# Patient Record
Sex: Male | Born: 1990 | Race: White | Hispanic: No | Marital: Single | Smoking: Never smoker
Health system: Southern US, Community
[De-identification: ages and names within clinical notes are randomized; demographics above are authoritative.]

## PROBLEM LIST (undated history)

## (undated) HISTORY — PX: TYMPANOSTOMY TUBE PLACEMENT: SHX32

---

## 2015-12-31 ENCOUNTER — Emergency Department
Admission: EM | Admit: 2015-12-31 | Discharge: 2015-12-31 | Disposition: A | Discharge status: Home / Self Care | Payer: Worker's Compensation | Attending: Emergency Medicine | Admitting: Emergency Medicine

## 2015-12-31 ENCOUNTER — Emergency Department: Payer: Worker's Compensation

## 2015-12-31 ENCOUNTER — Encounter: Payer: Self-pay | Admitting: Emergency Medicine

## 2015-12-31 DIAGNOSIS — S92301A Fracture of unspecified metatarsal bone(s), right foot, initial encounter for closed fracture: Secondary | ICD-10-CM

## 2015-12-31 DIAGNOSIS — S93431A Sprain of tibiofibular ligament of right ankle, initial encounter: Secondary | ICD-10-CM | POA: Insufficient documentation

## 2015-12-31 DIAGNOSIS — Y9367 Activity, basketball: Secondary | ICD-10-CM | POA: Diagnosis not present

## 2015-12-31 DIAGNOSIS — Y92310 Basketball court as the place of occurrence of the external cause: Secondary | ICD-10-CM | POA: Insufficient documentation

## 2015-12-31 DIAGNOSIS — S99921A Unspecified injury of right foot, initial encounter: Secondary | ICD-10-CM | POA: Diagnosis present

## 2015-12-31 DIAGNOSIS — X501XXA Overexertion from prolonged static or awkward postures, initial encounter: Secondary | ICD-10-CM | POA: Diagnosis not present

## 2015-12-31 DIAGNOSIS — S93491A Sprain of other ligament of right ankle, initial encounter: Secondary | ICD-10-CM

## 2015-12-31 DIAGNOSIS — Y990 Civilian activity done for income or pay: Secondary | ICD-10-CM | POA: Diagnosis not present

## 2015-12-31 DIAGNOSIS — S92351A Displaced fracture of fifth metatarsal bone, right foot, initial encounter for closed fracture: Secondary | ICD-10-CM | POA: Insufficient documentation

## 2015-12-31 MED ORDER — MELOXICAM 15 MG PO TABS: 15.0000 mg | ORAL_TABLET | Freq: Every day | ORAL | Status: AC

## 2015-12-31 NOTE — ED Provider Notes (Signed)
[facility] Emergency Department Provider Note  ____________________________________________  Time seen: Approximately 7:55 PM  I have reviewed the triage vital signs and the nursing notes.   HISTORY  Chief Complaint Foot Injury    HPI Zachary Wong is a 25 y.o. male who presents emergency department complaining of right foot pain. 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 He states that his ankle rolled and he is not experiencing pain to the lateral portion mid foot. Patient denies any numbness or tingling to the distal extremity. Patient does endorse some swelling to both the lateral foot as well as his ankle. Patient denies any ankle pain. He denies any knee pain. Patient denied his head or lose consciousness at any point. Patient has not taken any medications prior to arrival.   History reviewed. No pertinent past medical history.  There are no active problems to display for this patient.   History reviewed. No pertinent past surgical history.  Current Outpatient Rx  Name  Route  Sig  Dispense  Refill  . meloxicam (MOBIC) 15 MG tablet   Oral   Take 1 tablet (15 mg total) by mouth daily.   30 tablet   0     Allergies Review of patient's allergies indicates no known allergies.  History reviewed. No pertinent family history.  Social History Social History  Substance Use Topics  . Smoking status: Never Smoker   . Smokeless tobacco: None  . Alcohol Use: None     Review of Systems  Constitutional: No fever/chills Cardiovascular: no chest pain. Respiratory: no cough. No SOB. Musculoskeletal: Negative for back pain.Positive for right lateral foot pain. Skin: Negative for rash. Neurological: Negative for headaches, focal weakness or numbness. 10-point ROS otherwise negative.  ____________________________________________   PHYSICAL EXAM:  VITAL SIGNS: ED  Triage Vitals  Enc Vitals Group     BP --      Pulse Rate 12/31/15 1854 105     Resp 12/31/15 1854 18     Temp 12/31/15 1854 98.1 F (36.7 C)     Temp Source 12/31/15 1854 Oral     SpO2 12/31/15 1854 99 %     Weight 12/31/15 1854 320 lb (145.151 kg)     Height 12/31/15 1854 6\' 4"  (1.93 m)     Head Cir --      Peak Flow --      Pain Score --      Pain Loc --      Pain Edu? --      Excl. in GC? --      Constitutional: Alert and oriented. Well appearing and in no acute distress. Eyes: Conjunctivae are normal. PERRL. EOMI. Head: Atraumatic. Cardiovascular: Normal rate, regular rhythm. Normal S1 and S2.  Good peripheral circulation. Respiratory: Normal respiratory effort without tachypnea or retractions. Lungs CTAB. Musculoskeletal: No visible deformity to right foot when compared to left. Patient does have edema noted to the lateral malleolus as well as the lateral portion of the foot. Patient does have full range of motion to his ankle. Full range of motion to all digits. Patient is tender to palpation over the base of the fifth metatarsal. No tenderness to palpation over the ankle joint. No palpable abnormality. Dorsalis pedis pulse and cap refill are present. Sensation intact 5 digits. Neurologic:  Normal speech and language. No gross focal neurologic deficits are appreciated.  Skin:  Skin is  warm, dry and intact. No rash noted. Psychiatric: Mood and affect are normal. Speech and behavior are normal. Patient exhibits appropriate insight and judgement.   ____________________________________________   LABS (all labs ordered are listed, but only abnormal results are displayed)  Labs Reviewed - No data to display ____________________________________________  EKG   ____________________________________________  RADIOLOGY Festus Barren Angelisse Riso, personally viewed and evaluated these images (plain radiographs) as part of my medical decision making, as well as reviewing the  written report by the radiologist.  Dg Ankle Complete Right  12/31/2015  CLINICAL DATA:  Jumped and landed wrong, lateral ankle pain and swelling EXAM: RIGHT ANKLE - COMPLETE 3+ VIEW COMPARISON:  None. FINDINGS: Mild soft tissue swelling over the lateral malleolus. Mortise intact. Fracture at the base of the fifth metatarsal noted. Small ankle joint effusion. IMPRESSION: Fracture fifth metatarsal.  Ankle sprain. Electronically Signed   By: Esperanza Heir M.D.   On: 12/31/2015 19:39   Dg Foot Complete Right  12/31/2015  CLINICAL DATA:  Jumped and landed awkward playing basketball with lateral right foot pain. EXAM: RIGHT FOOT COMPLETE - 3+ VIEW COMPARISON:  None. FINDINGS: Examination demonstrates a mildly displaced transverse fracture over the base of the fifth metatarsal. Remainder the exam is within normal. IMPRESSION: Displaced fracture along the base of the fifth metatarsal. Electronically Signed   By: Elberta Fortis M.D.   On: 12/31/2015 19:38    ____________________________________________    PROCEDURES  Procedure(s) performed:    SPLINT APPLICATION Date/Time: 9:11 PM Authorized by: Racheal Patches Consent: Verbal consent obtained. Risks and benefits: risks, benefits and alternatives were discussed Consent given by: patient Splint applied by: ED tech  Location details: Right ankle/foot  Splint type: Posterior OCL  Supplies used: Ortho-Glass  Post-procedure: The splinted body part was neurovascularly unchanged following the procedure. Patient tolerance: Patient tolerated the procedure well with no immediate complications.      Medications - No data to display   ____________________________________________   INITIAL IMPRESSION / ASSESSMENT AND PLAN / ED COURSE  Pertinent labs & imaging results that were available during my care of the patient were reviewed by me and considered in my medical decision making (see chart for details).  Patient's diagnosis is  consistent with fracture to the base of the fifth metatarsal and posterior talofibular ligament ankle sprain. Patient will be discharged home with prescriptions for anti-inflammatories for symptom control. Patient is to follow up with orthopedics. Patient is given ED precautions to return to the ED for any worsening or new symptoms.     ____________________________________________  FINAL CLINICAL IMPRESSION(S) / ED DIAGNOSES  Final diagnoses:  Fracture of fifth metatarsal bone, right, closed, initial encounter  Sprain of posterior talofibular ligament of ankle, right, initial encounter      NEW MEDICATIONS STARTED DURING THIS VISIT:  Discharge Medication List as of 12/31/2015  8:15 PM    START taking these medications   Details  meloxicam (MOBIC) 15 MG tablet Take 1 tablet (15 mg total) by mouth daily., Starting 12/31/2015, Until Discontinued, Print            This chart was dictated using voice recognition software/Dragon. Despite best efforts to proofread, errors can occur which can change the meaning. Any change was purely unintentional.    Racheal Patches, PA-C 12/31/15 2111  Jene Every, MD 12/31/15 2156

## 2015-12-31 NOTE — ED Notes (Signed)
Reports working at [employer] and hurt left foot.

## 2015-12-31 NOTE — Discharge Instructions (Signed)
Ankle Sprain °An ankle sprain is an injury to the strong, fibrous tissues (ligaments) that hold the bones of your ankle joint together.  °CAUSES °An ankle sprain is usually caused by a fall or by twisting your ankle. Ankle sprains most commonly occur when you step on the outer edge of your foot, and your ankle turns inward. People who participate in sports are more prone to these types of injuries.  °SYMPTOMS  °· Pain in your ankle. The pain may be present at rest or only when you are trying to stand or walk. °· Swelling. °· Bruising. Bruising may develop immediately or within 1 to 2 days after your injury. °· Difficulty standing or walking, particularly when turning corners or changing directions. °DIAGNOSIS  °Your caregiver will ask you details about your injury and perform a physical exam of your ankle to determine if you have an ankle sprain. During the physical exam, your caregiver will press on and apply pressure to specific areas of your foot and ankle. Your caregiver will try to move your ankle in certain ways. An X-ray exam may be done to be sure a bone was not broken or a ligament did not separate from one of the bones in your ankle (avulsion fracture).  °TREATMENT  °Certain types of braces can help stabilize your ankle. Your caregiver can make a recommendation for this. Your caregiver may recommend the use of medicine for pain. If your sprain is severe, your caregiver may refer you to a surgeon who helps to restore function to parts of your skeletal system (orthopedist) or a physical therapist. °HOME CARE INSTRUCTIONS  °· Apply ice to your injury for 1-2 days or as directed by your caregiver. Applying ice helps to reduce inflammation and pain. °· Put ice in a plastic bag. °· Place a towel between your skin and the bag. °· Leave the ice on for 15-20 minutes at a time, every 2 hours while you are awake. °· Only take over-the-counter or prescription medicines for pain, discomfort, or fever as directed by  your caregiver. °· Elevate your injured ankle above the level of your heart as much as possible for 2-3 days. °· If your caregiver recommends crutches, use them as instructed. Gradually put weight on the affected ankle. Continue to use crutches or a cane until you can walk without feeling pain in your ankle. °· If you have a plaster splint, wear the splint as directed by your caregiver. Do not rest it on anything harder than a pillow for the first 24 hours. Do not put weight on it. Do not get it wet. You may take it off to take a shower or bath. °· You may have been given an elastic bandage to wear around your ankle to provide support. If the elastic bandage is too tight (you have numbness or tingling in your foot or your foot becomes cold and blue), adjust the bandage to make it comfortable. °· If you have an air splint, you may blow more air into it or let air out to make it more comfortable. You may take your splint off at night and before taking a shower or bath. Wiggle your toes in the splint several times per day to decrease swelling. °SEEK MEDICAL CARE IF:  °· You have rapidly increasing bruising or swelling. °· Your toes feel extremely cold or you lose feeling in your foot. °· Your pain is not relieved with medicine. °SEEK IMMEDIATE MEDICAL CARE IF: °· Your toes are numb or blue. °·   You have severe pain that is increasing. MAKE SURE YOU:   Understand these instructions.  Will watch your condition.  Will get help right away if you are not doing well or get worse.   This information is not intended to replace advice given to you by your health care provider. Make sure you discuss any questions you have with your health care provider.   Document Released: 09/17/2005 Document Revised: 10/08/2014 Document Reviewed: 09/29/2011 Elsevier Interactive Patient Education 2016 [employer] or Splint Care Casts and splints support injured limbs and keep bones from moving while they heal. It is  important to care for your cast or splint at home.  HOME CARE INSTRUCTIONS  Keep the cast or splint uncovered during the drying period. It can take 24 to 48 hours to dry if it is made of plaster. A fiberglass cast will dry in less than 1 hour.  Do not rest the cast on anything harder than a pillow for the first 24 hours.  Do not put weight on your injured limb or apply pressure to the cast until your health care provider gives you permission.  Keep the cast or splint dry. Wet casts or splints can lose their shape and may not support the limb as well. A wet cast that has lost its shape can also create harmful pressure on your skin when it dries. Also, wet skin can become infected.  Cover the cast or splint with a plastic bag when bathing or when out in the rain or snow. If the cast is on the trunk of the body, take sponge baths until the cast is removed.  If your cast does become wet, dry it with a towel or a blow dryer on the cool setting only.  Keep your cast or splint clean. Soiled casts may be wiped with a moistened cloth.  Do not place any hard or soft foreign objects under your cast or splint, such as cotton, toilet paper, lotion, or powder.  Do not try to scratch the skin under the cast with any object. The object could get stuck inside the cast. Also, scratching could lead to an infection. If itching is a problem, use a blow dryer on a cool setting to relieve discomfort.  Do not trim or cut your cast or remove padding from inside of it.  Exercise all joints next to the injury that are not immobilized by the cast or splint. For example, if you have a long leg cast, exercise the hip joint and toes. If you have an arm cast or splint, exercise the shoulder, elbow, thumb, and fingers.  Elevate your injured arm or leg on 1 or 2 pillows for the first 1 to 3 days to decrease swelling and pain.It is best if you can comfortably elevate your cast so it is higher than your heart. SEEK MEDICAL  CARE IF:   Your cast or splint cracks.  Your cast or splint is too tight or too loose.  You have unbearable itching inside the cast.  Your cast becomes wet or develops a soft spot or area.  You have a bad smell coming from inside your cast.  You get an object stuck under your cast.  Your skin around the cast becomes red or raw.  You have new pain or worsening pain after the cast has been applied. SEEK IMMEDIATE MEDICAL CARE IF:   You have fluid leaking through the cast.  You are unable to move your fingers or toes.  You have discolored (blue or white), cool, painful, or very swollen fingers or toes beyond the cast.  You have tingling or numbness around the injured area.  You have severe pain or pressure under the cast.  You have any difficulty with your breathing or have shortness of breath.  You have chest pain.   This information is not intended to replace advice given to you by your health care provider. Make sure you discuss any questions you have with your health care provider.   Document Released: 09/14/2000 Document Revised: 07/08/2013 Document Reviewed: 03/26/2013 Elsevier Interactive Patient Education 2016 [employer]  Metatarsal Fracture A metatarsal fracture is a break in a metatarsal bone. Metatarsal bones connect your toe bones to your ankle bones. CAUSES This type of fracture may be caused by:  A sudden twisting of your foot.  A fall onto your foot.  Overuse or repetitive exercise. RISK FACTORS This condition is more likely to develop in people who:  Play contact sports.  Have a bone disease.  Have a low calcium level. SYMPTOMS Symptoms of this condition include:  Pain that is worse when walking or standing.  Pain when pressing on the foot or moving the toes.  Swelling.  Bruising on the top or bottom of the foot.  A foot that appears shorter than the other one. DIAGNOSIS This condition is diagnosed with a physical exam. You may  also have imaging tests, such as:  X-rays.  A CT scan.  MRI. TREATMENT Treatment for this condition depends on its severity and whether a bone has moved out of place. Treatment may involve:  Rest.  Wearing foot support such as a cast, splint, or boot for several weeks.  Using crutches.  Surgery to move bones back into the right position. Surgery is usually needed if there are many pieces of broken bone or bones that are very out of place (displaced fracture).  Physical therapy. This may be needed to help you regain full movement and strength in your foot. You will need to return to your health care provider to have X-rays taken until your bones heal. Your health care provider will look at the X-rays to make sure that your foot is healing well. HOME CARE INSTRUCTIONS  If You Have a Cast:  Do not stick anything inside the cast to scratch your skin. Doing that increases your risk of infection.  Check the skin around the cast every day. Report any concerns to your health care provider. You may put lotion on dry skin around the edges of the cast. Do not apply lotion to the skin underneath the cast.  Keep the cast clean and dry. If You Have a Splint or a Supportive Boot:  Wear it as directed by your health care provider. Remove it only as directed by your health care provider.  Loosen it if your toes become numb and tingle, or if they turn cold and blue.  Keep it clean and dry. Bathing  Do not take baths, swim, or use a hot tub until your health care provider approves. Ask your health care provider if you can take showers. You may only be allowed to take sponge baths for bathing.  If your health care provider approves bathing and showering, cover the cast or splint with a watertight plastic bag to protect it from water. Do not let the cast or splint get wet. Managing Pain, Stiffness, and Swelling  If directed, apply ice to the injured area (if you have a splint,  not a  cast).  Put ice in a plastic bag.  Place a towel between your skin and the bag.  Leave the ice on for 20 minutes, 2-3 times per day.  Move your toes often to avoid stiffness and to lessen swelling.  Raise (elevate) the injured area above the level of your heart while you are sitting or lying down. Driving  Do not drive or operate heavy machinery while taking pain medicine.  Do not drive while wearing foot support on a foot that you use for driving. Activity  Return to your normal activities as directed by your health care provider. Ask your health care provider what activities are safe for you.  Perform exercises as directed by your health care provider or physical therapist. Safety  Do not use the injured foot to support your body weight until your health care provider says that you can. Use crutches as directed by your health care provider. General Instructions  Do not put pressure on any part of the cast or splint until it is fully hardened. This may take several hours.  Do not use any tobacco products, including cigarettes, chewing tobacco, or e-cigarettes. Tobacco can delay bone healing. If you need help quitting, ask your health care provider.  Take medicines only as directed by your health care provider.  Keep all follow-up visits as directed by your health care provider. This is important. SEEK MEDICAL CARE IF:  You have a fever.  Your cast, splint, or boot is too loose or too tight.  Your cast, splint, or boot is damaged.  Your pain medicine is not helping.  You have pain, tingling, or numbness in your foot that is not going away. SEEK IMMEDIATE MEDICAL CARE IF:  You have severe pain.  You have tingling or numbness in your foot that is getting worse.  Your foot feels cold or becomes numb.  Your foot changes color.   This information is not intended to replace advice given to you by your health care provider. Make sure you discuss any questions you have  with your health care provider.   Document Released: 06/09/2002 Document Revised: 02/01/2015 Document Reviewed: 07/14/2014 Elsevier Interactive Patient Education [employer]

## 2017-09-04 ENCOUNTER — Emergency Department (HOSPITAL_COMMUNITY): Payer: Self-pay

## 2017-09-04 ENCOUNTER — Encounter (HOSPITAL_COMMUNITY): Payer: Self-pay

## 2017-09-04 ENCOUNTER — Other Ambulatory Visit: Payer: Self-pay

## 2017-09-04 ENCOUNTER — Emergency Department (HOSPITAL_COMMUNITY)
Admission: EM | Admit: 2017-09-04 | Discharge: 2017-09-04 | Disposition: A | Discharge status: Home / Self Care | Payer: Self-pay | Attending: Emergency Medicine | Admitting: Emergency Medicine

## 2017-09-04 DIAGNOSIS — S7001XA Contusion of right hip, initial encounter: Secondary | ICD-10-CM | POA: Insufficient documentation

## 2017-09-04 DIAGNOSIS — Y999 Unspecified external cause status: Secondary | ICD-10-CM | POA: Insufficient documentation

## 2017-09-04 DIAGNOSIS — Y92411 Interstate highway as the place of occurrence of the external cause: Secondary | ICD-10-CM | POA: Insufficient documentation

## 2017-09-04 DIAGNOSIS — S56912A Strain of unspecified muscles, fascia and tendons at forearm level, left arm, initial encounter: Secondary | ICD-10-CM | POA: Insufficient documentation

## 2017-09-04 DIAGNOSIS — Z79899 Other long term (current) drug therapy: Secondary | ICD-10-CM | POA: Insufficient documentation

## 2017-09-04 DIAGNOSIS — Y9389 Activity, other specified: Secondary | ICD-10-CM | POA: Insufficient documentation

## 2017-09-04 MED ORDER — IBUPROFEN 400 MG PO TABS
600.0000 mg | ORAL_TABLET | Freq: Once | ORAL | Status: AC
  Administered 2017-09-04: 600 mg via ORAL
  Filled 2017-09-04: qty 1

## 2017-09-04 MED ORDER — HYDROCODONE-ACETAMINOPHEN 5-325 MG PO TABS
2.0000 | ORAL_TABLET | Freq: Once | ORAL | Status: AC
  Administered 2017-09-04: 2 via ORAL
  Filled 2017-09-04: qty 2

## 2017-09-04 NOTE — ED Notes (Signed)
ED Provider at bedside. 
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
## 2017-09-04 NOTE — ED Notes (Signed)
Pt ambulatory to bathroom at this time.
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
## 2017-09-04 NOTE — ED Notes (Signed)
Pt to xray at this time.
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
## 2021-10-01 ENCOUNTER — Encounter: Payer: Self-pay | Admitting: Emergency Medicine

## 2021-10-01 ENCOUNTER — Other Ambulatory Visit: Payer: Self-pay

## 2021-10-01 ENCOUNTER — Emergency Department
Admission: EM | Admit: 2021-10-01 | Discharge: 2021-10-01 | Disposition: A | Discharge status: Home / Self Care | Payer: Self-pay | Attending: Emergency Medicine | Admitting: Emergency Medicine

## 2021-10-01 DIAGNOSIS — H9201 Otalgia, right ear: Secondary | ICD-10-CM | POA: Insufficient documentation

## 2021-10-01 MED ORDER — PREDNISONE 20 MG PO TABS
60.0000 mg | ORAL_TABLET | Freq: Once | ORAL | Status: AC
  Administered 2021-10-01: 60 mg via ORAL
  Filled 2021-10-01: qty 3

## 2021-10-01 MED ORDER — PREDNISONE 10 MG (21) PO TBPK: ORAL_TABLET | ORAL | 0 refills | Status: AC

## 2021-10-01 NOTE — Discharge Instructions (Addendum)
Take tapered steroid as directed.  

## 2021-10-01 NOTE — ED Triage Notes (Signed)
Pt reports right ear is clogged up and has been for a week. Pt states has been taking sudafed and other OTC meds with no relief.

## 2021-10-01 NOTE — ED Notes (Signed)
Pt complains of right ear being "clogged". Pt complains of no pain

## 2021-10-01 NOTE — ED Provider Notes (Signed)
° °  [facility] Provider Note    Event Date/Time   First MD Initiated Contact with Patient 10/01/21 1613     (approximate)   History   Otalgia   HPI  Zachary Wong is a 31 y.o. male presents to the emergency department with right ear fullness since having viral URI 1 week ago.  No fever, chills or history of otitis media.  No history of diabetes.      Physical Exam   Triage Vital Signs: ED Triage Vitals  Enc Vitals Group     BP 10/01/21 1502 139/81     Pulse Rate 10/01/21 1502 88     Resp 10/01/21 1502 16     Temp 10/01/21 1502 98.2 F (36.8 C)     Temp Source 10/01/21 1502 Oral     SpO2 10/01/21 1502 98 %     Weight 10/01/21 1449 (!) 335 lb 1.6 oz (152 kg)     Height 10/01/21 1449 6\' 2"  (1.88 m)     Head Circumference --      Peak Flow --      Pain Score 10/01/21 1449 0     Pain Loc --      Pain Edu? --      Excl. in [place]? --     Most recent vital signs: Vitals:   10/01/21 1502  BP: 139/81  Pulse: 88  Resp: 16  Temp: 98.2 F (36.8 C)  SpO2: 98%    General: Awake, no distress.  HEENT: Right tympanic membrane partially occluded with cerumen.  Right TM appears to be bulging with clear to cloudy fluid behind TM.  No erythema. CV:  Good peripheral perfusion.  Resp:  Normal effort.  Abd:  No distention.  Other:     ED Results / Procedures / Treatments   Labs (all labs ordered are listed, but only abnormal results are displayed) Labs Reviewed - No data to display   EKG     RADIOLOGY    PROCEDURES:       MEDICATIONS ORDERED IN ED: Medications  predniSONE (DELTASONE) tablet 60 mg (has no administration in time range)     IMPRESSION / MDM / ASSESSMENT AND PLAN / ED COURSE  I reviewed the triage vital signs and the nursing notes.                              Differential diagnosis includes, but is not limited to, otitis media, otitis externa, middle ear effusion   Assessment and plan Ear  pain 31 year old male presents to the emergency department with right ear fullness.  Physical exam concerning for middle ear effusion.  Patient has already tried Flonase and antihistamine with little relief.  We will proceed with prednisone taper.     FINAL CLINICAL IMPRESSION(S) / ED DIAGNOSES   Final diagnoses:  Right ear pain     Rx / DC Orders   ED Discharge Orders          Ordered    predniSONE (STERAPRED UNI-PAK 21 TAB) 10 MG (21) TBPK tablet        10/01/21 1636             Note:  This document was prepared using Dragon voice recognition software and may include unintentional dictation errors.    11/29/21 [place], PA-C 10/01/21 1642    11/29/21, MD 10/01/21 [phone]

## 2021-12-05 ENCOUNTER — Ambulatory Visit
Admission: EM | Admit: 2021-12-05 | Discharge: 2021-12-05 | Disposition: A | Discharge status: Home / Self Care | Payer: Self-pay | Attending: Emergency Medicine | Admitting: Emergency Medicine

## 2021-12-05 ENCOUNTER — Other Ambulatory Visit: Payer: Self-pay

## 2021-12-05 ENCOUNTER — Encounter: Payer: Self-pay | Admitting: Emergency Medicine

## 2021-12-05 DIAGNOSIS — J020 Streptococcal pharyngitis: Secondary | ICD-10-CM

## 2021-12-05 LAB — POCT RAPID STREP A (OFFICE): Rapid Strep A Screen: POSITIVE — AB

## 2021-12-05 MED ORDER — AMOXICILLIN 875 MG PO TABS: 875.0000 mg | ORAL_TABLET | Freq: Two times a day (BID) | ORAL | 0 refills | Status: AC

## 2021-12-05 NOTE — Discharge Instructions (Addendum)
You have strep throat.  Take the amoxicillin as directed.  Follow up with your primary care provider if your symptoms are not improving.   ? ?

## 2021-12-05 NOTE — ED Triage Notes (Signed)
Pt here with sore throat and fever x 2 days.  

## 2021-12-05 NOTE — ED Provider Notes (Signed)
?[facility] ? ? ? ?CSN: [id number] ?Arrival date & time: 12/05/21  1156 ? ? ?  ? ?History   ?Chief Complaint ?Chief Complaint  ?Patient presents with  ? Sore Throat  ? Fever  ? ? ?HPI ?Zachary Wong is a 31 y.o. male.  Accompanied by his mother, patient presents with fever and sore throat x2 days.  No rash, cough, shortness of breath, or other symptoms.  Treatment at home with ibuprofen.  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 ? ?The history is provided by the patient and a parent.  ? ?History reviewed. No pertinent past medical history. ? ?There are no problems to display for this patient. ? ? ?History reviewed. No pertinent surgical history. ? ? ? ? ?Home Medications   ? ?Prior to Admission medications   ?Medication Sig Start Date End Date Taking? Authorizing Provider  ?amoxicillin (AMOXIL) 875 MG tablet Take 1 tablet (875 mg total) by mouth 2 (two) times daily for 10 days. 12/05/21 12/15/21 Yes Sharion Balloon, NP  ?meloxicam (MOBIC) 15 MG tablet Take 1 tablet (15 mg total) by mouth daily. 12/31/15   Cuthriell, Charline Bills, PA-C  ?predniSONE (STERAPRED UNI-PAK 21 TAB) 10 MG (21) TBPK tablet 6,5,4,3,2,1 10/01/21   Lannie Fields, PA-C  ? ? ?Family History ?Family History  ?Family history unknown: Yes  ? ? ?Social History ?Social History  ? ?Tobacco Use  ? Smoking status: Never  ? Smokeless tobacco: Never  ?Substance Use Topics  ? Alcohol use: No  ? Drug use: No  ? ? ? ?Allergies   ?Red dye ? ? ?Review of Systems ?Review of Systems  ?Constitutional:  Positive for fever. Negative for chills.  ?HENT:  Positive for sore throat. Negative for ear pain.   ?Respiratory:  Negative for cough and shortness of breath.   ?Cardiovascular:  Negative for chest pain and palpitations.  ?Gastrointestinal:  Negative for diarrhea and vomiting.  ?Skin:  Negative for color change and rash.  ?All other systems reviewed and are negative. ? ? ?Physical Exam ?Triage Vital Signs ?ED Triage Vitals  ?Enc  Vitals Group  ?   BP   ?   Pulse   ?   Resp   ?   Temp   ?   Temp src   ?   SpO2   ?   Weight   ?   Height   ?   Head Circumference   ?   Peak Flow   ?   Pain Score   ?   Pain Loc   ?   Pain Edu?   ?   Excl. in [place]?   ? ?No data found. ? ?Updated Vital Signs ?There were no vitals taken for this visit. ? ?Visual Acuity ?Right Eye Distance:   ?Left Eye Distance:   ?Bilateral Distance:   ? ?Right Eye Near:   ?Left Eye Near:    ?Bilateral Near:    ? ?Physical Exam ?Vitals and nursing note reviewed.  ?Constitutional:   ?   General: He is not in acute distress. ?   Appearance: He is well-developed.  ?HENT:  ?   Right Ear: Tympanic membrane normal.  ?   Left Ear: Tympanic membrane normal.  ?   Nose: Nose normal.  ?   Mouth/Throat:  ?   Mouth: Mucous membranes are moist.  ?   Pharynx: Posterior oropharyngeal erythema present.  ?Cardiovascular:  ?   Rate  and Rhythm: Normal rate and regular rhythm.  ?   Heart sounds: Normal heart sounds.  ?Pulmonary:  ?   Effort: Pulmonary effort is normal. No respiratory distress.  ?   Breath sounds: Normal breath sounds.  ?Musculoskeletal:  ?   Cervical back: Neck supple.  ?Skin: ?   General: Skin is warm and dry.  ?Neurological:  ?   Mental Status: He is alert.  ?Psychiatric:     ?   Mood and Affect: Mood normal.     ?   Behavior: Behavior normal.  ? ? ? ?UC Treatments / Results  ?Labs ?(all labs ordered are listed, but only abnormal results are displayed) ?Labs Reviewed  ?POCT RAPID STREP A (OFFICE) - Abnormal; Notable for the following components:  ?    Result Value  ? Rapid Strep A Screen Positive (*)   ? All other components within normal limits  ? ? ?EKG ? ? ?Radiology ?No results found. ? ?Procedures ?Procedures (including critical care time) ? ?Medications Ordered in UC ?Medications - No data to display ? ?Initial Impression / Assessment and Plan / UC Course  ?I have reviewed the triage vital signs and the nursing notes. ? ?Pertinent labs & imaging results that were available during  my care of the patient were reviewed by me and considered in my medical decision making (see chart for details). ? ? Strep pharyngitis.  Rapid strep positive.  Treating with amoxicillin.  Discussed Tylenol or ibuprofen as needed.  Instructed patient to follow up with his PCP if his symptoms are not improving.  He agrees to plan of care.  ? ? ? ?Final Clinical Impressions(s) / UC Diagnoses  ? ?Final diagnoses:  ?Strep pharyngitis  ? ? ? ?Discharge Instructions   ? ?  ?You have strep throat.  Take the amoxicillin as directed.  Follow up with your primary care provider if your symptoms are not improving.   ? ? ? ? ? ? ?ED Prescriptions   ? ? Medication Sig Dispense Auth. Provider  ? amoxicillin (AMOXIL) 875 MG tablet Take 1 tablet (875 mg total) by mouth 2 (two) times daily for 10 days. 20 tablet Sharion Balloon, NP  ? ?  ? ?PDMP not reviewed this encounter. ?  ?Sharion Balloon, NP ?12/05/21 1233 ? ?

## 2022-09-10 ENCOUNTER — Ambulatory Visit
Admission: EM | Admit: 2022-09-10 | Discharge: 2022-09-10 | Disposition: A | Discharge status: Home / Self Care | Payer: Self-pay | Attending: Emergency Medicine | Admitting: Emergency Medicine

## 2022-09-10 DIAGNOSIS — H6121 Impacted cerumen, right ear: Secondary | ICD-10-CM

## 2022-09-10 DIAGNOSIS — H9201 Otalgia, right ear: Secondary | ICD-10-CM

## 2022-09-10 MED ORDER — AMOXICILLIN 875 MG PO TABS: 875.0000 mg | ORAL_TABLET | Freq: Two times a day (BID) | ORAL | 0 refills | Status: AC

## 2022-09-10 NOTE — ED Provider Notes (Signed)
Zachary Wong    CSN: [id number] Arrival date & time: 09/10/22  1817      History   Chief Complaint Chief Complaint  Patient presents with   Otalgia    HPI Zachary Wong is a 31 y.o. male.  Patient presents with 1 week history of right ear pain.  No ear drainage, fever, chills, sore throat, cough, shortness of breath, or other symptoms.  Treatment at home with ibuprofen and OTC eardrops.  He reports history of frequent ear infections.  The history is provided by the patient, a parent and medical records.    History reviewed. No pertinent past medical history.  There are no problems to display for this patient.   Past Surgical History:  Procedure Laterality Date   TYMPANOSTOMY TUBE PLACEMENT Bilateral    as a baby       Home Medications    Prior to Admission medications   Medication Sig Start Date End Date Taking? Authorizing Provider  amoxicillin (AMOXIL) 875 MG tablet Take 1 tablet (875 mg total) by mouth 2 (two) times daily for 7 days. 09/10/22 09/17/22 Yes Sharion Balloon, NP  meloxicam (MOBIC) 15 MG tablet Take 1 tablet (15 mg total) by mouth daily. 12/31/15   Cuthriell, Charline Bills, PA-C  predniSONE (STERAPRED UNI-PAK 21 TAB) 10 MG (21) TBPK tablet 6,5,4,3,2,1 10/01/21   Lannie Fields, PA-C    Family History Family History  Family history unknown: Yes    Social History Social History   Tobacco Use   Smoking status: Never   Smokeless tobacco: Never  Substance Use Topics   Alcohol use: No   Drug use: No     Allergies   Red dye   Review of Systems Review of Systems  Constitutional:  Negative for chills and fever.  HENT:  Positive for ear pain. Negative for ear discharge and sore throat.   Respiratory:  Negative for cough and shortness of breath.   Gastrointestinal:  Negative for diarrhea and vomiting.  Skin:  Negative for color change and rash.  All other systems reviewed and are negative.    Physical Exam Triage Vital  Signs ED Triage Vitals  Enc Vitals Group     BP 09/10/22 1934 (!) 154/80     Pulse Rate 09/10/22 1934 60     Resp 09/10/22 1934 19     Temp 09/10/22 1934 98 F (36.7 C)     Temp src --      SpO2 09/10/22 1934 98 %     Weight --      Height 09/10/22 1931 _0  (1.88 m)     Head Circumference --      Peak Flow --      Pain Score 09/10/22 1930 8     Pain Loc --      Pain Edu? --      Excl. in [place]? --    No data found.  Updated Vital Signs BP (!) 154/80   Pulse 60   Temp 98 F (36.7 C)   Resp 19   Ht _1  (1.88 m)   SpO2 98%   BMI 43.02 kg/m   Visual Acuity Right Eye Distance:   Left Eye Distance:   Bilateral Distance:    Right Eye Near:   Left Eye Near:    Bilateral Near:     Physical Exam Vitals and nursing note reviewed.  Constitutional:      General: He is not in acute distress.  Appearance: He is well-developed. He is not ill-appearing.  HENT:     Right Ear: There is impacted cerumen.     Left Ear: Tympanic membrane and ear canal normal.     Nose: Nose normal.     Mouth/Throat:     Mouth: Mucous membranes are moist.     Pharynx: Oropharynx is clear.  Cardiovascular:     Rate and Rhythm: Normal rate and regular rhythm.     Heart sounds: Normal heart sounds.  Pulmonary:     Effort: Pulmonary effort is normal. No respiratory distress.     Breath sounds: Normal breath sounds.  Musculoskeletal:     Cervical back: Neck supple.  Skin:    General: Skin is warm and dry.  Neurological:     Mental Status: He is alert.  Psychiatric:        Mood and Affect: Mood normal.        Behavior: Behavior normal.      UC Treatments / Results  Labs (all labs ordered are listed, but only abnormal results are displayed) Labs Reviewed - No data to display  EKG   Radiology No results found.  Procedures Procedures (including critical care time)  Medications Ordered in UC Medications - No data to display  Initial Impression / Assessment and Plan / UC  Course  I have reviewed the triage vital signs and the nursing notes.  Pertinent labs & imaging results that were available during my care of the patient were reviewed by me and considered in my medical decision making (see chart for details).   Right otalgia, right cerumen impaction.  Cerumen partially removed with irrigation but unable to remove enough to visualize TM.  Discussed OTC ear wax kit and/or follow up with ENT.  Patient does not have health insurance and states he will not be able to afford to see specialist.  Because he has history of ear infections and has ear pain, treating with amoxicillin.  Tylenol or ibuprofen as needed.  Education provided on earwax buildup and earache.  Patient agrees to plan of care.   Final Clinical Impressions(s) / UC Diagnoses   Final diagnoses:  Acute otalgia, right  Impacted cerumen of right ear     Discharge Instructions      Follow up with an ENT.      ED Prescriptions     Medication Sig Dispense Auth. Provider   amoxicillin (AMOXIL) 875 MG tablet Take 1 tablet (875 mg total) by mouth 2 (two) times daily for 7 days. 14 tablet Sharion Balloon, NP      PDMP not reviewed this encounter.   Sharion Balloon, NP 09/10/22 2016

## 2022-09-10 NOTE — ED Triage Notes (Signed)
Patient to Urgent Care with complaints of right sided ear pain x1 week. Reports hx of frequent ear infections. Denies any known fevers.  Has been taking homepathic ear drops, ibuprofen.

## 2022-09-10 NOTE — Discharge Instructions (Addendum)
Follow up with an ENT.
